# Patient Record
Sex: Male | Born: 1945 | Race: White | Marital: Married
Health system: Southern US, Community
[De-identification: ages and names within clinical notes are randomized; demographics above are authoritative.]

---

## 2013-11-14 NOTE — ED Notes (Addendum)
Pt presents to ER c/o right side pain since 0100 this morning.  Pt states he was leaning over the console in his truck when he felt something "snap" & has had pain ever since.  Denies any other symptoms.

## 2013-11-15 MED ORDER — TIZANIDINE 4 MG TAB
4 mg | ORAL_TABLET | Freq: Four times a day (QID) | ORAL | Status: AC | PRN
Start: 2013-11-15 — End: ?

## 2013-11-15 NOTE — ED Provider Notes (Signed)
HPI Comments: Jeremy Reyes is a 68 y.o. male who presents to the ED C/O right side CP. Pt states that at 0100 yesterday he was in his truck, leaned over the console, heard a pop and started having right sided chest pain. Pt states that he also has some pain in his right upper back along the same level. Moving exacerbates the pain. Pt states that deep breathing is not painful. Pt denies head injury, fall, numbness, weakness, midline back pain and any other Sx or complaints. Pt states that he has been on a low carb diet, lost 42 pounds and was taken off his BP and cholesterol medications.   The history is provided by the patient.        Past Medical History   Diagnosis Date   ??? Cancer [facility])      prostate        Past Surgical History   Procedure Laterality Date   ??? Hx hemorrhoidectomy     ??? Hx prostatectomy           History reviewed. No pertinent family history.     History     Social History   ??? Marital Status: MARRIED     Spouse Name: N/A     Number of Children: N/A   ??? Years of Education: N/A     Occupational History   ??? Not on file.     Social History Main Topics   ??? Smoking status: Former Smoker   ??? Smokeless tobacco: Not on file   ??? Alcohol Use: No   ??? Drug Use: No   ??? Sexual Activity: Not on file     Other Topics Concern   ??? Not on file     Social History Narrative   ??? No narrative on file                  ALLERGIES: Review of patient's allergies indicates no known allergies.      Review of Systems   Constitutional: Negative for fever and chills.   HENT: Negative for sore throat.    Respiratory: Negative for cough, shortness of breath and wheezing.    Cardiovascular: Positive for chest pain. Negative for palpitations.   Gastrointestinal: Negative for nausea, vomiting, abdominal pain, diarrhea and abdominal distention.   Genitourinary: Negative for dysuria and urgency.   Musculoskeletal: Positive for back pain. Negative for myalgias, arthralgias and neck pain.   Skin: Negative for rash.    Neurological: Negative for dizziness, syncope, facial asymmetry, speech difficulty, weakness, numbness and headaches.   All other systems reviewed and are negative.      Filed Vitals:    11/14/13 2224   BP: 144/95   Pulse: 83   Temp: 97.7 ??F (36.5 ??C)   Resp: 18   Height: 5\' 11"  (1.803 m)   Weight: 78.019 kg (172 lb)   SpO2: 98%            Physical Exam   Constitutional: He is oriented to person, place, and time. He appears well-developed and well-nourished. No distress.   HENT:   Head: Normocephalic.   Mouth/Throat: Oropharynx is clear and moist.   Eyes: Conjunctivae and EOM are normal. Pupils are equal, round, and reactive to light.   Neck: Neck supple.   Cardiovascular: Normal rate, regular rhythm and normal heart sounds.    Pulmonary/Chest: Effort normal and breath sounds normal. No respiratory distress. He has no wheezes. He has no rales. He exhibits tenderness (right lower  rib cage laterally and posteriorly.  No edema, no erythema, no ecchymoses, no increased heat, no point tendernes, no crepitance. ).   Musculoskeletal: Normal range of motion.   Neurological: He is alert and oriented to person, place, and time.   Skin: Skin is warm and dry. He is not diaphoretic.   Nursing note and vitals reviewed.       MDM  Number of Diagnoses or Management Options  Intercostal muscle strain, initial encounter:   Right-sided chest wall pain:   Diagnosis management comments: Pt reached over a car console and felt a pop and pain in the right lower chest wall.  Likely not a rib fracture.  Could be an intercostal muscle injury.    CXR done.        Amount and/or Complexity of Data Reviewed  Tests in the radiology section of CPT??: ordered and reviewed    Patient Progress  Patient progress: stable      X-Ray, CT or other radiology findings or impressions:  XR CHEST PA LAT   Preliminary Result      CXR = no infiltrates, no effusions.  No PTX.  Ribs and soft tissues look WNL.        Progress notes, Consult notes or additional Procedure notes:     Reevaluation of patient:   I have reevaluated patient.  Patient is feeling the same.  Will have pt take OTC Ibuprofen and Tylenol.  Will treat with a muscle relaxer.   Follow up if not improving.       Dispo:  Patient was discharged in stable condition.  Patient is to return to emergency department with any new or worsening condition.        Procedures    Scribe Attestation:   November 15, 2013 at 1:54 AM - Janet BerlinGeorge Tiefenback scribing for and in the presence of Dr.Sofiah Lyne Carmela Hurt Teejay Meader, MD     Janet BerlinGeorge Tiefenback, Scribe      Provider Attestation:   I personally performed the services described in the documentation, reviewed the documentation, as recorded by the scribe in my presence, and it accurately and completely records my words and actions. November 15, 2013 at 5:49 AM - Norval MortonGAIL D Piedad Standiford, MD

## 2013-11-15 NOTE — ED Notes (Signed)
Patient up for discharge.  Discharge instructions reviewed with the patient, including medication prescriptions, if any, including it's  name, dosage, action, frequency, and side effects. Encouraged to adhere to MD discharge instructions. Patient verbalized understanding of all discharge instructions. Armband removed and shredded. Encouraged to voice any concerns, and all concerns addressed. Patient discharged in no apparent distress and stable vital signs.

## 2013-11-15 NOTE — ED Notes (Signed)
Patient updated in room. Waiting for x ray results and evaluation by MD. Complaint of pain upon deep breath. No respiratory distress noted.

## 2015-02-04 ENCOUNTER — Ambulatory Visit
Admit: 2015-02-04 | Discharge: 2015-02-04 | Payer: MEDICARE | Attending: Orthopaedic Surgery | Primary: Student in an Organized Health Care Education/Training Program

## 2015-02-04 DIAGNOSIS — M1712 Unilateral primary osteoarthritis, left knee: Secondary | ICD-10-CM

## 2015-02-04 MED ORDER — LIDOCAINE HCL 1 % (10 MG/ML) IJ SOLN
10 mg/mL (1 %) | Freq: Once | INTRAMUSCULAR | 0 refills | Status: AC
Start: 2015-02-04 — End: 2015-02-04

## 2015-02-04 MED ORDER — TRIAMCINOLONE ACETONIDE 40 MG/ML SUSP FOR INJECTION
40 mg/mL | Freq: Once | INTRAMUSCULAR | 0 refills | Status: AC
Start: 2015-02-04 — End: 2015-02-04

## 2015-02-04 NOTE — Patient Instructions (Signed)
Knee Pain or Injury: Care Instructions  Your Care Instructions     Injuries are a common cause of knee problems. Sudden (acute) injuries may be caused by a direct blow to the knee. They can also be caused by abnormal twisting, bending, or falling on the knee. Pain, bruising, or swelling may be severe, and may start within minutes of the injury.  Overuse is another cause of knee pain. Other causes are climbing stairs, kneeling, and other activities that use the knee. Everyday wear and tear, especially as you get older, also can cause knee pain.  Rest, along with home treatment, often relieves pain and allows your knee to heal. If you have a serious knee injury, you may need tests and treatment.  Follow-up care is a key part of your treatment and safety. Be sure to make and go to all appointments, and call your doctor if you are having problems. It's also a good idea to know your test results and keep a list of the medicines you take.  How can you care for yourself at home?  ?? Be safe with medicines. Read and follow all instructions on the label.  ?? If the doctor gave you a prescription medicine for pain, take it as prescribed.  ?? If you are not taking a prescription pain medicine, ask your doctor if you can take an over-the-counter medicine.  ?? Rest and protect your knee. Take a break from any activity that may cause pain.  ?? Put ice or a cold pack on your knee for 10 to 20 minutes at a time. Put a thin cloth between the ice and your skin.  ?? Prop up a sore knee on a pillow when you ice it or anytime you sit or lie down for the next 3 days. Try to keep it above the level of your heart. This will help reduce swelling.  ?? If your knee is not swollen, you can put moist heat, a heating pad, or a warm cloth on your knee.  ?? If your doctor recommends an elastic bandage, sleeve, or other type of support for your knee, wear it as directed.  ?? Follow your doctor's instructions about how much weight you can put on  your leg. Use a cane, crutches, or a walker as instructed.  ?? Follow your doctor's instructions about activity during your healing process. If you can do mild exercise, slowly increase your activity.  ?? Reach and stay at a healthy weight. Extra weight can strain the joints, especially the knees and hips, and make the pain worse. Losing even a few pounds may help.  When should you call for help?  Call 911 anytime you think you may need emergency care. For example, call if:  ?? You have symptoms of a blood clot in your lung (called a pulmonary embolism). These may include:  ?? Sudden chest pain.  ?? Trouble breathing.  ?? Coughing up blood.  Call your doctor now or seek immediate medical care if:  ?? You have severe or increasing pain.  ?? Your leg or foot turns cold or changes color.  ?? You cannot stand or put weight on your knee.  ?? Your knee looks twisted or bent out of shape.  ?? You cannot move your knee.  ?? You have signs of infection, such as:  ?? Increased pain, swelling, warmth, or redness.  ?? Red streaks leading from the knee.  ?? Pus draining from a place on your knee.  ?? A   fever.  ?? You have signs of a blood clot in your leg (called a deep vein thrombosis), such as:  ?? Pain in your calf, back of the knee, thigh, or groin.  ?? Redness and swelling in your leg or groin.  Watch closely for changes in your health, and be sure to contact your doctor if:  ?? You have tingling, weakness, or numbness in your knee.  ?? You have any new symptoms, such as swelling.  ?? You have bruises from a knee injury that last longer than 2 weeks.  ?? You do not get better as expected.  Where can you learn more?  Go to http://www.healthwise.net/[employer]  Enter K195 in the search box to learn more about "Knee Pain or Injury: Care Instructions."  ?? 2006-2016 [employer]. Care instructions adapted under license by [employer] (which disclaims liability or warranty  for this information). This care instruction is for use with your licensed healthcare professional. If you have questions about a medical condition or this instruction, always ask your healthcare professional. Healthwise, Incorporated disclaims any warranty or liability for your use of this information.  Content Version: 11.0.578772; Current as of: Sep 06, 2014

## 2015-02-04 NOTE — Progress Notes (Signed)
Patient: Jeremy Reyes                MRN: [id number]       SSN: [id number]  Date of Birth: 05-07-1945        AGE: 69 y.o.        SEX: male  Body mass index is 27.75 kg/(m^2).    PCP: Not On File [facility]  02/04/15      Chief Complaint   Patient presents with   ??? New Patient   ??? Knee Pain       HISTORY OF PRESENT ILLNESS: Jeremy Reyes is a 69 y.o. male who presents to the office for L knee pain x years. Pt is requesting injections today. He has XR documented OA. Although we have not seen him, he is tentatively going to schedule knee replacement surgery of that same knee in [place]. The reason he was here today is because he was in town visiting his father who is in hospice.    Review of Systems   Constitutional: Negative.    HENT: Negative.    Eyes: Negative.    Respiratory: Negative.    Cardiovascular: Negative.    Gastrointestinal: Negative.    Genitourinary: Negative.    Musculoskeletal: Positive for joint pain (L knee).   Skin: Negative.    Neurological: Negative.    Endo/Heme/Allergies: Negative.    Psychiatric/Behavioral: Negative.            Social History     Social History   ??? Marital status: UNKNOWN     Spouse name: N/A   ??? Number of children: N/A   ??? Years of education: N/A     Occupational History   ??? Not on file.     Social History Main Topics   ??? Smoking status: Former Smoker   ??? Smokeless tobacco: Not on file   ??? Alcohol use No   ??? Drug use: No   ??? Sexual activity: Not on file     Other Topics Concern   ??? Not on file     Social History Narrative        Past Medical History   Diagnosis Date   ??? Cancer [facility])      prostate        No Known Allergies      Current Outpatient Prescriptions   Medication Sig   ??? DULoxetine (CYMBALTA) 60 mg capsule Take 60 mg by mouth daily.   ??? tiZANidine (ZANAFLEX) 4 mg tablet Take 1 Tab by mouth every six (6) hours as needed.     No current facility-administered medications for this visit.         Physical Exam   Constitutional: he is oriented to person, place, and time and well-developed, well-nourished, and in no distress. No distress.   HENT:   Head: Normocephalic and atraumatic.   Right Ear: Hearing normal.   Left Ear: Hearing normal.   Nose: Nose normal.   Eyes: Conjunctivae, EOM and lids are normal. Pupils are equal, round, and reactive to light.   Neck: Trachea normal.   Pulmonary/Chest: Effort normal and breath sounds normal. No respiratory distress.   Abdominal: Soft.   Neurological: he is alert and oriented to person, place, and time.   Skin: Skin is warm, dry and intact. he is not diaphoretic.   Psychiatric: Affect normal.   Nursing note and vitals reviewed.    Ortho Exam  L knee: No effusion, so soft tissue swelling, no increased warmth.  Ligaments were stable, ROM was normal.    Procedure: After timeout and under sterile conditions, patient's L knee was injected with 6 cc of Xylocaine and 2 cc of Kenalog.    [facility]  OFFICE PROCEDURE PROGRESS NOTE        Chart reviewed for the following:   I, Francina AmesWayne T Anden Bartolo, MD, have reviewed the History, Physical and updated the Allergic reactions for Jeremy Reyes     TIME OUT performed immediately prior to start of procedure:   I, Francina AmesWayne T Anita Mcadory, MD, have performed the following reviews on Jeremy Reyes prior to the start of the procedure:            * Patient was identified by name and date of birth   * Agreement on procedure being performed was verified  * Risks and Benefits explained to the patient  * Procedure site verified and marked as necessary  * Patient was positioned for comfort  * Consent was signed and verified     Time: 3:39 PM      Date of procedure: 02/04/2015    Procedure performed by:  Francina AmesWayne T Terresa Marlett, MD    Provider assisted by: None     Patient assisted by: self    How tolerated by patient: tolerated the procedure well with no complications    Comments: none    RADIOGRAPHS: No x-rays were taken today.       IMPRESSION & PLAN: I feel his L knee pain is due to his previously XR documented OA. At the request of the pt I injected his L knee with cortisone today. I will see him back PRN.      Scribed by Sherran NeedsAmanda Naughton (Scribekick) as dictated by Lennox SoldersWayne Fenix Rorke, MD.

## 2016-05-24 ENCOUNTER — Other Ambulatory Visit: Payer: Self-pay | Admitting: Neurosurgery

## 2016-05-24 DIAGNOSIS — M4807 Spinal stenosis, lumbosacral region: Secondary | ICD-10-CM

## 2016-05-25 ENCOUNTER — Other Ambulatory Visit (HOSPITAL_COMMUNITY): Payer: Self-pay | Admitting: Neurosurgery

## 2016-05-25 DIAGNOSIS — C61 Malignant neoplasm of prostate: Secondary | ICD-10-CM

## 2016-05-26 ENCOUNTER — Ambulatory Visit
Admission: RE | Admit: 2016-05-26 | Discharge: 2016-05-26 | Disposition: A | Discharge status: Home / Self Care | Payer: Medicare Other | Source: Ambulatory Visit | Attending: Neurosurgery | Admitting: Neurosurgery

## 2016-05-26 ENCOUNTER — Other Ambulatory Visit: Payer: Self-pay | Admitting: Neurosurgery

## 2016-05-26 DIAGNOSIS — M4807 Spinal stenosis, lumbosacral region: Secondary | ICD-10-CM

## 2016-05-26 MED ORDER — METHYLPREDNISOLONE ACETATE 40 MG/ML INJ SUSP (RADIOLOG
120.0000 mg | Freq: Once | INTRAMUSCULAR | Status: AC
  Administered 2016-05-26: 120 mg via INTRA_ARTICULAR

## 2016-05-26 MED ORDER — IOPAMIDOL (ISOVUE-M 200) INJECTION 41%
1.0000 mL | Freq: Once | INTRAMUSCULAR | Status: AC
  Administered 2016-05-26: 1 mL via INTRA_ARTICULAR

## 2016-05-26 NOTE — Discharge Instructions (Signed)

## 2016-06-02 ENCOUNTER — Encounter (HOSPITAL_COMMUNITY)
Admission: RE | Admit: 2016-06-02 | Discharge: 2016-06-02 | Disposition: A | Discharge status: Home / Self Care | Payer: Medicare Other | Source: Ambulatory Visit | Attending: Neurosurgery | Admitting: Neurosurgery

## 2016-06-02 DIAGNOSIS — C61 Malignant neoplasm of prostate: Secondary | ICD-10-CM

## 2016-06-02 MED ORDER — TECHNETIUM TC 99M MEDRONATE IV KIT: 25.0000 | PACK | Freq: Once | INTRAVENOUS | Status: DC | PRN

## 2016-07-13 ENCOUNTER — Other Ambulatory Visit: Payer: Self-pay | Admitting: Neurosurgery

## 2016-07-13 DIAGNOSIS — M5137 Other intervertebral disc degeneration, lumbosacral region: Secondary | ICD-10-CM

## 2016-08-09 ENCOUNTER — Other Ambulatory Visit: Payer: Self-pay | Admitting: Neurosurgery

## 2016-08-09 DIAGNOSIS — M5137 Other intervertebral disc degeneration, lumbosacral region: Secondary | ICD-10-CM

## 2016-08-12 ENCOUNTER — Other Ambulatory Visit: Payer: Medicare Other

## 2016-08-12 ENCOUNTER — Ambulatory Visit
Admission: RE | Admit: 2016-08-12 | Discharge: 2016-08-12 | Disposition: A | Discharge status: Home / Self Care | Payer: Medicare Other | Source: Ambulatory Visit | Attending: Neurosurgery | Admitting: Neurosurgery

## 2016-08-12 ENCOUNTER — Other Ambulatory Visit: Payer: Self-pay | Admitting: Neurosurgery

## 2016-08-12 DIAGNOSIS — M5137 Other intervertebral disc degeneration, lumbosacral region: Secondary | ICD-10-CM

## 2016-08-12 MED ORDER — METHYLPREDNISOLONE ACETATE 40 MG/ML INJ SUSP (RADIOLOG
120.0000 mg | Freq: Once | INTRAMUSCULAR | Status: AC
  Administered 2016-08-12: 120 mg via INTRA_ARTICULAR

## 2016-08-12 MED ORDER — IOPAMIDOL (ISOVUE-M 200) INJECTION 41%
1.0000 mL | Freq: Once | INTRAMUSCULAR | Status: AC
  Administered 2016-08-12: 1 mL via INTRA_ARTICULAR

## 2016-08-12 NOTE — Discharge Instructions (Signed)

## 2016-11-08 ENCOUNTER — Other Ambulatory Visit: Payer: Self-pay | Admitting: Neurosurgery

## 2016-11-08 DIAGNOSIS — M5440 Lumbago with sciatica, unspecified side: Secondary | ICD-10-CM

## 2016-11-10 ENCOUNTER — Other Ambulatory Visit: Payer: Self-pay | Admitting: Neurosurgery

## 2016-11-10 DIAGNOSIS — M5440 Lumbago with sciatica, unspecified side: Secondary | ICD-10-CM

## 2016-11-24 ENCOUNTER — Other Ambulatory Visit: Payer: Self-pay | Admitting: Neurosurgery

## 2016-11-24 DIAGNOSIS — M5440 Lumbago with sciatica, unspecified side: Secondary | ICD-10-CM

## 2016-11-29 ENCOUNTER — Ambulatory Visit
Admission: RE | Admit: 2016-11-29 | Discharge: 2016-11-29 | Disposition: A | Discharge status: Home / Self Care | Payer: Medicare Other | Source: Ambulatory Visit | Attending: Neurosurgery | Admitting: Neurosurgery

## 2016-11-29 DIAGNOSIS — M5440 Lumbago with sciatica, unspecified side: Secondary | ICD-10-CM

## 2016-11-29 MED ORDER — IOPAMIDOL (ISOVUE-M 200) INJECTION 41%
1.0000 mL | Freq: Once | INTRAMUSCULAR | Status: AC
  Administered 2016-11-29: 1 mL via INTRA_ARTICULAR

## 2016-11-29 MED ORDER — METHYLPREDNISOLONE ACETATE 40 MG/ML INJ SUSP (RADIOLOG
120.0000 mg | Freq: Once | INTRAMUSCULAR | Status: AC
  Administered 2016-11-29: 120 mg via INTRA_ARTICULAR

## 2017-04-20 ENCOUNTER — Other Ambulatory Visit: Payer: Self-pay | Admitting: Neurosurgery

## 2017-04-20 DIAGNOSIS — M4807 Spinal stenosis, lumbosacral region: Secondary | ICD-10-CM

## 2017-05-03 ENCOUNTER — Ambulatory Visit
Admission: RE | Admit: 2017-05-03 | Discharge: 2017-05-03 | Disposition: A | Discharge status: Home / Self Care | Payer: Medicare Other | Source: Ambulatory Visit | Attending: Neurosurgery | Admitting: Neurosurgery

## 2017-05-03 DIAGNOSIS — M4807 Spinal stenosis, lumbosacral region: Secondary | ICD-10-CM

## 2017-05-03 MED ORDER — METHYLPREDNISOLONE ACETATE 40 MG/ML INJ SUSP (RADIOLOG
120.0000 mg | Freq: Once | INTRAMUSCULAR | Status: AC
  Administered 2017-05-03: 120 mg via INTRA_ARTICULAR

## 2017-05-03 MED ORDER — IOPAMIDOL (ISOVUE-M 200) INJECTION 41%
1.0000 mL | Freq: Once | INTRAMUSCULAR | Status: AC
  Administered 2017-05-03: 1 mL via INTRA_ARTICULAR

## 2017-05-03 NOTE — Discharge Instructions (Signed)

## 2017-05-10 ENCOUNTER — Other Ambulatory Visit: Payer: Medicare Other

## 2017-06-22 ENCOUNTER — Other Ambulatory Visit: Payer: Self-pay | Admitting: Student

## 2017-06-22 DIAGNOSIS — M5440 Lumbago with sciatica, unspecified side: Secondary | ICD-10-CM

## 2017-07-08 ENCOUNTER — Ambulatory Visit
Admission: RE | Admit: 2017-07-08 | Discharge: 2017-07-08 | Disposition: A | Discharge status: Home / Self Care | Payer: Medicare Other | Source: Ambulatory Visit | Attending: Student | Admitting: Student

## 2017-07-08 DIAGNOSIS — M5440 Lumbago with sciatica, unspecified side: Secondary | ICD-10-CM

## 2017-07-08 MED ORDER — IOPAMIDOL (ISOVUE-M 200) INJECTION 41%
1.0000 mL | Freq: Once | INTRAMUSCULAR | Status: AC
  Administered 2017-07-08: 1 mL via INTRA_ARTICULAR

## 2017-07-08 MED ORDER — METHYLPREDNISOLONE ACETATE 40 MG/ML INJ SUSP (RADIOLOG
120.0000 mg | Freq: Once | INTRAMUSCULAR | Status: AC
  Administered 2017-07-08: 120 mg via INTRA_ARTICULAR

## 2017-09-13 ENCOUNTER — Other Ambulatory Visit: Payer: Self-pay | Admitting: Student

## 2017-09-13 DIAGNOSIS — G8929 Other chronic pain: Secondary | ICD-10-CM

## 2017-09-13 DIAGNOSIS — M545 Low back pain: Principal | ICD-10-CM

## 2017-09-22 ENCOUNTER — Other Ambulatory Visit: Payer: Self-pay | Admitting: Student

## 2017-09-22 ENCOUNTER — Ambulatory Visit
Admission: RE | Admit: 2017-09-22 | Discharge: 2017-09-22 | Disposition: A | Discharge status: Home / Self Care | Payer: Medicare Other | Source: Ambulatory Visit | Attending: Student | Admitting: Student

## 2017-09-22 DIAGNOSIS — M545 Low back pain: Principal | ICD-10-CM

## 2017-09-22 DIAGNOSIS — G8929 Other chronic pain: Secondary | ICD-10-CM

## 2017-09-22 MED ORDER — METHYLPREDNISOLONE ACETATE 40 MG/ML INJ SUSP (RADIOLOG
120.0000 mg | Freq: Once | INTRAMUSCULAR | Status: AC
  Administered 2017-09-22: 120 mg via INTRA_ARTICULAR

## 2017-09-22 MED ORDER — IOPAMIDOL (ISOVUE-M 200) INJECTION 41%
1.0000 mL | Freq: Once | INTRAMUSCULAR | Status: AC
  Administered 2017-09-22: 1 mL via INTRA_ARTICULAR

## 2017-10-25 ENCOUNTER — Other Ambulatory Visit: Payer: Self-pay | Admitting: Neurosurgery

## 2017-10-25 DIAGNOSIS — M5440 Lumbago with sciatica, unspecified side: Secondary | ICD-10-CM

## 2017-11-07 NOTE — Discharge Instructions (Signed)
Radio Frequency Ablation Post Procedure Discharge Instructions ° °1. May resume a regular diet and any medications that you routinely take (including pain medications). °2. No driving day of procedure. °3. Upon discharge go home and rest for at least 4 hours.  May use an ice pack as needed to injection sites on back. °4. Remove bandades later, today. ° ° ° °Please contact our office at [phone] for the following symptoms: ° °· Fever greater than 100 degrees °· Increased swelling, pain, or redness at injection site. ° ° °Thank you for visiting [facility]. °

## 2017-11-08 ENCOUNTER — Ambulatory Visit
Admission: RE | Admit: 2017-11-08 | Discharge: 2017-11-08 | Disposition: A | Discharge status: Home / Self Care | Payer: Medicare Other | Source: Ambulatory Visit | Attending: Neurosurgery | Admitting: Neurosurgery

## 2017-11-08 ENCOUNTER — Other Ambulatory Visit: Payer: Self-pay | Admitting: Neurosurgery

## 2017-11-08 DIAGNOSIS — M5440 Lumbago with sciatica, unspecified side: Secondary | ICD-10-CM

## 2017-11-08 MED ORDER — MIDAZOLAM HCL 2 MG/2ML IJ SOLN
1.0000 mg | INTRAMUSCULAR | Status: DC | PRN
  Administered 2017-11-08: 1 mg via INTRAVENOUS

## 2017-11-08 MED ORDER — FENTANYL CITRATE (PF) 100 MCG/2ML IJ SOLN
25.0000 ug | INTRAMUSCULAR | Status: DC | PRN
  Administered 2017-11-08: 50 ug via INTRAVENOUS

## 2017-11-08 MED ORDER — SODIUM CHLORIDE 0.9 % IV SOLN
Freq: Once | INTRAVENOUS | Status: AC
  Administered 2017-11-08: 13:00:00 via INTRAVENOUS

## 2017-11-08 MED ORDER — KETOROLAC TROMETHAMINE 30 MG/ML IJ SOLN
30.0000 mg | Freq: Once | INTRAMUSCULAR | Status: AC
  Administered 2017-11-08: 30 mg via INTRAVENOUS

## 2017-11-08 MED ORDER — METHYLPREDNISOLONE ACETATE 40 MG/ML INJ SUSP (RADIOLOG
120.0000 mg | Freq: Once | INTRAMUSCULAR | Status: AC
  Administered 2017-11-08: 120 mg via INTRAMUSCULAR

## 2018-04-30 IMAGING — NM NM BONE WHOLE BODY
2 series · 2 of 2 positions shown · non-contrast
Comparison: MRI 05/06/2016. Lumbar spine 04/21/2016.

CLINICAL DATA: Prostate cancer.

EXAM:
NUCLEAR MEDICINE WHOLE BODY BONE SCAN
TECHNIQUE: Whole body anterior and posterior images were obtained approximately
3 hours after intravenous injection of radiopharmaceutical.
RADIOPHARMACEUTICALS:  21.0 MCi 1echnetium-QQm MDP IV

[Series 1: wbr_bone_40 whole body · 2.66mm/px · 1 of 1 slices shown (1 of 2)]
[im 1/1]
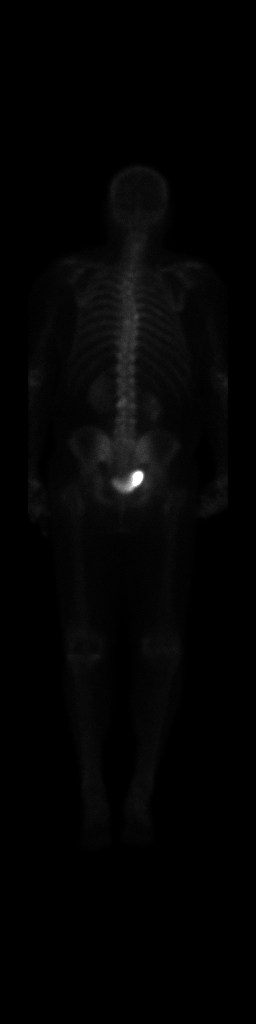

[Series 1: wbr_bone_40 whole body · 2.66mm/px · 1 of 1 slices shown (2 of 2)]
[im 1/1]
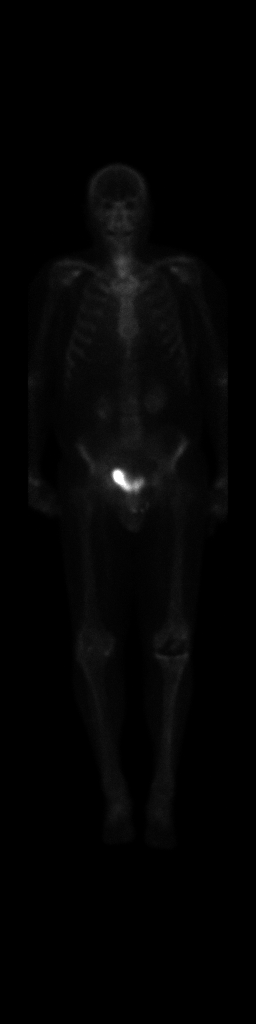

[2 of 2 positions shown; findings below may reference images not displayed]

FINDINGS: Bilateral renal function and excretion. Right kidney appears small.
Thoracic spine scoliosis . Punctate region of slight increase
activity noted in the left portion of T7 and central portion of T8.
This could be degenerative. Thoracic spine MRI can be obtained. Mild
increased activity noted over the left portion of L3. This may be
degenerative as noted on recent MRI. No clearcut focal lesion noted
in this region on recent MRI. Total left knee replacement. No other
focal bony abnormality identified.
IMPRESSION: 1. Bilateral renal function excretion. Right kidney appears small .
renal ultrasound can be obtained to further evaluate.

2. Punctate area of increased activity noted over the left aspect of
T7 an central portion of T8. These changes could be degenerative.
Enhanced thoracic spine MRI can be obtained to further evaluate.

3. Mild increased activity noted over left portion of L3. This may
be degenerative as noted on recent MRI. No clearcut focal lesion
noted this region on recent MRI.
